# Patient Record
Sex: Female | Born: 2000 | Race: White | Hispanic: No | Marital: Single | Smoking: Never smoker
Health system: Southern US, Community
[De-identification: ages and names within clinical notes are randomized; demographics above are authoritative.]

---
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
## 2019-03-09 ENCOUNTER — Telehealth: Payer: Self-pay | Admitting: *Deleted

## 2019-03-09 MED ORDER — NORGESTIM-ETH ESTRAD TRIPHASIC 0.18/0.215/0.25 MG-35 MCG PO TABS: 1.0000 | ORAL_TABLET | Freq: Every day | ORAL | 1 refills | Status: DC

## 2019-03-09 NOTE — Telephone Encounter (Signed)
Pt requested a refill of her OCP, generic Ortho Tri Cyclen be sent to [facility] on [address].  Pt's mom is very concerned with COVID therefore has delayed a visit. Dr Dellis Filbert is aware and has okay'd a 6 month refill.  Rx sent. Pt aware. KW CMA

## 2019-04-17 ENCOUNTER — Ambulatory Visit: Payer: 59 | Attending: Internal Medicine

## 2019-04-17 DIAGNOSIS — Z20822 Contact with and (suspected) exposure to covid-19: Secondary | ICD-10-CM

## 2019-04-18 LAB — NOVEL CORONAVIRUS, NAA: SARS-CoV-2, NAA: NOT DETECTED

## 2019-08-21 ENCOUNTER — Encounter: Payer: Self-pay | Admitting: Obstetrics & Gynecology

## 2019-08-21 ENCOUNTER — Other Ambulatory Visit: Payer: Self-pay

## 2019-08-21 ENCOUNTER — Ambulatory Visit (INDEPENDENT_AMBULATORY_CARE_PROVIDER_SITE_OTHER): Payer: 59 | Admitting: Obstetrics & Gynecology

## 2019-08-21 VITALS — BP 116/74 | Ht 63.0 in | Wt 149.0 lb

## 2019-08-21 DIAGNOSIS — Z01419 Encounter for gynecological examination (general) (routine) without abnormal findings: Secondary | ICD-10-CM | POA: Diagnosis not present

## 2019-08-21 DIAGNOSIS — Z113 Encounter for screening for infections with a predominantly sexual mode of transmission: Secondary | ICD-10-CM | POA: Diagnosis not present

## 2019-08-21 DIAGNOSIS — Z3041 Encounter for surveillance of contraceptive pills: Secondary | ICD-10-CM

## 2019-08-21 MED ORDER — NORGESTIM-ETH ESTRAD TRIPHASIC 0.18/0.215/0.25 MG-35 MCG PO TABS: 1.0000 | ORAL_TABLET | Freq: Every day | ORAL | 4 refills | Status: DC

## 2019-08-21 NOTE — Patient Instructions (Signed)
1. Well female exam with routine gynecological exam Normal gynecologic exam.  Will start Pap test at age 19.  Breast exam normal.  Body mass index 26.39.  Healthy vegetarian nutrition.  Good fitness.  2. Encounter for surveillance of contraceptive pills Well on the generic of Ortho Tri-Cyclen.  No contraindication to continue.  Prescription sent to pharmacy.  3. Screen for STD (sexually transmitted disease) Condom use recommended. - C. trachomatis/N. gonorrhoeae RNA  Other orders - Norgestimate-Ethinyl Estradiol Triphasic (ORTHO TRI-CYCLEN, 28,) 0.18/0.215/0.25 MG-35 MCG tablet; Take 1 tablet by mouth daily.  Skyy, it was a pleasure seeing you today!  I will inform you of your results as soon as they are available.

## 2019-08-21 NOTE — Progress Notes (Signed)
    Pamela Green 12/05/2000 [id number]   History:    19 y.o. G0  Boyfriend.  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  Scientist, water quality.  RP:  Established patient presenting for annual gyn exam   HPI:  Well on Tri-Cyclen generic x 2 yrs.  No BTB.  No pelvic pain.  Normal vaginal secretions.  No pain with IC.  Urine and bowel movements normal.  Breasts normal.  BMI 26.39.  Physically active.  Vegetarian.   Past medical history,surgical history, family history and social history were all reviewed and documented in the EPIC chart.  Gynecologic History Patient's last menstrual period was 07/26/2019.  Obstetric History OB History  Gravida Para Term Preterm AB Living  0 0 0 0 0 0  SAB TAB Ectopic Multiple Live Births  0 0 0 0 0     ROS: A ROS was performed and pertinent positives and negatives are included in the history.  GENERAL: No fevers or chills. HEENT: No change in vision, no earache, sore throat or sinus congestion. NECK: No pain or stiffness. CARDIOVASCULAR: No chest pain or pressure. No palpitations. PULMONARY: No shortness of breath, cough or wheeze. GASTROINTESTINAL: No abdominal pain, nausea, vomiting or diarrhea, melena or bright red blood per rectum. GENITOURINARY: No urinary frequency, urgency, hesitancy or dysuria. MUSCULOSKELETAL: No joint or muscle pain, no back pain, no recent trauma. DERMATOLOGIC: No rash, no itching, no lesions. ENDOCRINE: No polyuria, polydipsia, no heat or cold intolerance. No recent change in weight. HEMATOLOGICAL: No anemia or easy bruising or bleeding. NEUROLOGIC: No headache, seizures, numbness, tingling or weakness. PSYCHIATRIC: No depression, no loss of interest in normal activity or change in sleep pattern.     Exam:   BP 116/74   Ht 5\' 3"  (1.6 m)   Wt 149 lb (67.6 kg)   LMP 07/26/2019   BMI 26.39 kg/m   Body mass index is 26.39 kg/m.  General appearance : Well developed well nourished female. No acute distress HEENT: Eyes: no retinal hemorrhage  or exudates,  Neck supple, trachea midline, no carotid bruits, no thyroidmegaly Lungs: Clear to auscultation, no rhonchi or wheezes, or rib retractions  Heart: Regular rate and rhythm, no murmurs or gallops Breast:Examined in sitting and supine position were symmetrical in appearance, no palpable masses or tenderness,  no skin retraction, no nipple inversion, no nipple discharge, no skin discoloration, no axillary or supraclavicular lymphadenopathy Abdomen: no palpable masses or tenderness, no rebound or guarding Extremities: no edema or skin discoloration or tenderness  Pelvic: Vulva: Normal             Vagina: No gross lesions or discharge  Cervix: No gross lesions or discharge.  Gono-Chlam done.  Uterus  AV, normal size, shape and consistency, non-tender and mobile  Adnexa  Without masses or tenderness  Anus: Normal   Assessment/Plan:  19 y.o. female for annual exam   1. Well female exam with routine gynecological exam Normal gynecologic exam.  Will start Pap test at age 89.  Breast exam normal.  Body mass index 26.39.  Healthy vegetarian nutrition.  Good fitness.  2. Encounter for surveillance of contraceptive pills Well on the generic of Ortho Tri-Cyclen.  No contraindication to continue.  Prescription sent to pharmacy.  3. Screen for STD (sexually transmitted disease) Condom use recommended. - C. trachomatis/N. gonorrhoeae RNA  Other orders - Norgestimate-Ethinyl Estradiol Triphasic (ORTHO TRI-CYCLEN, 28,) 0.18/0.215/0.25 MG-35 MCG tablet; Take 1 tablet by mouth daily.  05-04-1981 MD, 12:42 PM 08/21/2019

## 2019-08-22 ENCOUNTER — Encounter: Payer: Self-pay | Admitting: *Deleted

## 2019-08-22 LAB — C. TRACHOMATIS/N. GONORRHOEAE RNA
C. trachomatis RNA, TMA: NOT DETECTED
N. gonorrhoeae RNA, TMA: NOT DETECTED

## 2020-02-12 ENCOUNTER — Other Ambulatory Visit: Payer: Self-pay | Admitting: Obstetrics & Gynecology

## 2020-11-01 ENCOUNTER — Other Ambulatory Visit: Payer: Self-pay | Admitting: Obstetrics & Gynecology
